# Patient Record
Sex: Female | Born: 1970 | Race: Black or African American | Hispanic: No | Smoking: Never smoker
Health system: Southern US, Community
[De-identification: ages and names within clinical notes are randomized; demographics above are authoritative.]

## PROBLEM LIST (undated history)

## (undated) DIAGNOSIS — I10 Essential (primary) hypertension: Secondary | ICD-10-CM

## (undated) HISTORY — PX: CHOLECYSTECTOMY: SHX55

## (undated) HISTORY — PX: TUBAL LIGATION: SHX77

## (undated) HISTORY — PX: BREAST REDUCTION SURGERY: SHX8

## (undated) HISTORY — PX: BACK SURGERY: SHX140

---

## 2019-04-12 ENCOUNTER — Other Ambulatory Visit: Payer: Self-pay

## 2019-04-12 ENCOUNTER — Encounter (HOSPITAL_BASED_OUTPATIENT_CLINIC_OR_DEPARTMENT_OTHER): Payer: Self-pay

## 2019-04-12 ENCOUNTER — Emergency Department (HOSPITAL_BASED_OUTPATIENT_CLINIC_OR_DEPARTMENT_OTHER)
Admission: EM | Admit: 2019-04-12 | Discharge: 2019-04-12 | Disposition: A | Discharge status: Home / Self Care | Payer: No Typology Code available for payment source | Attending: Emergency Medicine | Admitting: Emergency Medicine

## 2019-04-12 ENCOUNTER — Emergency Department (HOSPITAL_BASED_OUTPATIENT_CLINIC_OR_DEPARTMENT_OTHER): Payer: No Typology Code available for payment source

## 2019-04-12 DIAGNOSIS — S8992XA Unspecified injury of left lower leg, initial encounter: Secondary | ICD-10-CM | POA: Diagnosis present

## 2019-04-12 DIAGNOSIS — Y9301 Activity, walking, marching and hiking: Secondary | ICD-10-CM | POA: Diagnosis not present

## 2019-04-12 DIAGNOSIS — Y929 Unspecified place or not applicable: Secondary | ICD-10-CM | POA: Diagnosis not present

## 2019-04-12 DIAGNOSIS — Z79899 Other long term (current) drug therapy: Secondary | ICD-10-CM | POA: Diagnosis not present

## 2019-04-12 DIAGNOSIS — Z91010 Allergy to peanuts: Secondary | ICD-10-CM | POA: Diagnosis not present

## 2019-04-12 DIAGNOSIS — S82839A Other fracture of upper and lower end of unspecified fibula, initial encounter for closed fracture: Secondary | ICD-10-CM

## 2019-04-12 DIAGNOSIS — Z91040 Latex allergy status: Secondary | ICD-10-CM | POA: Diagnosis not present

## 2019-04-12 DIAGNOSIS — W101XXA Fall (on)(from) sidewalk curb, initial encounter: Secondary | ICD-10-CM | POA: Diagnosis not present

## 2019-04-12 DIAGNOSIS — S82392A Other fracture of lower end of left tibia, initial encounter for closed fracture: Secondary | ICD-10-CM | POA: Diagnosis not present

## 2019-04-12 DIAGNOSIS — W19XXXA Unspecified fall, initial encounter: Secondary | ICD-10-CM

## 2019-04-12 DIAGNOSIS — Z23 Encounter for immunization: Secondary | ICD-10-CM | POA: Insufficient documentation

## 2019-04-12 DIAGNOSIS — I10 Essential (primary) hypertension: Secondary | ICD-10-CM | POA: Insufficient documentation

## 2019-04-12 DIAGNOSIS — Y999 Unspecified external cause status: Secondary | ICD-10-CM | POA: Diagnosis not present

## 2019-04-12 HISTORY — DX: Essential (primary) hypertension: I10

## 2019-04-12 MED ORDER — TETANUS-DIPHTH-ACELL PERTUSSIS 5-2.5-18.5 LF-MCG/0.5 IM SUSP
0.5000 mL | Freq: Once | INTRAMUSCULAR | Status: AC
  Administered 2019-04-12: 20:00:00 0.5 mL via INTRAMUSCULAR
  Filled 2019-04-12: qty 0.5

## 2019-04-12 MED ORDER — ACETAMINOPHEN 500 MG PO TABS
1000.0000 mg | ORAL_TABLET | Freq: Once | ORAL | Status: AC
  Administered 2019-04-12: 1000 mg via ORAL
  Filled 2019-04-12: qty 2

## 2019-04-12 MED ORDER — IBUPROFEN 400 MG PO TABS
400.0000 mg | ORAL_TABLET | Freq: Once | ORAL | Status: AC
  Administered 2019-04-12: 400 mg via ORAL
  Filled 2019-04-12: qty 1

## 2019-04-12 MED ORDER — TRAMADOL HCL 50 MG PO TABS: 50.0000 mg | ORAL_TABLET | Freq: Four times a day (QID) | ORAL | 0 refills | Status: AC | PRN

## 2019-04-12 NOTE — ED Provider Notes (Signed)
[facility] EMERGENCY DEPARTMENT Provider Note   CSN: [id number] Arrival date & time: 04/12/19  1806    History   Chief Complaint Chief Complaint  Patient presents with  . Fall    HPI Megan Garrett is a 48 y.o. female.     HPI  48 year old female with a history of hypertension presents with concern for mechanical fall with left ankle pain.  Patient reports she was loose food and stepped off a small curb step off and fell, falling onto her right knee and hurting her left ankle.  She did not hit her head, no loss of consciousness.  Denies any other injuries from the fall.  No headache, no nausea, no vomiting, no neck pain, no back pain.  Reports abrasion to the right knee, but denies significant pain there.  Reports she was unable to bear any weight on the left ankle with severe pain in that area.  Reports some tingling to the distal foot.  And pain that is significantly worsened by moving. Past Medical History:  Diagnosis Date  . Hypertension     There are no active problems to display for this patient.   Past Surgical History:  Procedure Laterality Date  . BACK SURGERY    . BREAST REDUCTION SURGERY    . CHOLECYSTECTOMY    . TUBAL LIGATION       OB History   No obstetric history on file.      Home Medications    Prior to Admission medications   Medication Sig Start Date End Date Taking? Authorizing Provider  hydrochlorothiazide (HYDRODIURIL) 25 MG tablet Take 25 mg by mouth daily.   Yes [provider]  Multiple Vitamin (MULTIVITAMIN) tablet Take 1 tablet by mouth daily.   Yes [provider]  omega-3 acid ethyl esters (LOVAZA) 1 g capsule Take 1 g by mouth daily.   Yes [provider]  traMADol (ULTRAM) 50 MG tablet Take 1 tablet (50 mg total) by mouth every 6 (six) hours as needed. 04/12/19   Alvira MondaySchlossman, Dorissa Stinnette, MD    Family History No family history on file.  Social History Social History   Tobacco Use  . Smoking status:  Never Smoker  . Smokeless tobacco: Never Used  Substance Use Topics  . Alcohol use: Never    Frequency: Never  . Drug use: Never     Allergies   Latex; Peanut-containing drug products; Pork-derived products; and Sulfa antibiotics   Review of Systems Review of Systems  Constitutional: Negative for fever.  Respiratory: Negative for shortness of breath.   Cardiovascular: Negative for chest pain.  Musculoskeletal: Positive for arthralgias and gait problem.  Skin: Negative for rash.  Neurological: Negative for headaches.     Physical Exam Updated Vital Signs BP (!) 117/103 (BP Location: Right Wrist)   Pulse (!) 110   Temp 98.2 F (36.8 C) (Oral)   Resp 18   Ht 5\' 7"  (1.702 m)   Wt 130.2 kg   LMP 03/22/2019   SpO2 100%   BMI 44.95 kg/m   Physical Exam Vitals signs and nursing note reviewed.  Constitutional:      General: She is not in acute distress.    Appearance: She is well-developed. She is not diaphoretic.  HENT:     Head: Normocephalic and atraumatic.  Eyes:     Conjunctiva/sclera: Conjunctivae normal.  Neck:     Musculoskeletal: Normal range of motion.  Cardiovascular:     Rate and Rhythm: Normal rate and regular  rhythm.     Heart sounds: No gallop.   Pulmonary:     Effort: Pulmonary effort is normal. No respiratory distress.  Musculoskeletal:     Right knee: She exhibits normal range of motion. Lacerations: abrasion.     Left ankle: She exhibits decreased range of motion and swelling. Tenderness. Lateral malleolus and AITFL tenderness found. No medial malleolus, no head of 5th metatarsal and no proximal fibula tenderness found.  Skin:    General: Skin is warm and dry.     Findings: No erythema or rash.  Neurological:     Mental Status: She is alert and oriented to person, place, and time.      ED Treatments / Results  Labs (all labs ordered are listed, but only abnormal results are displayed) Labs Reviewed - No data to display  EKG None   Radiology Dg Ankle Complete Left  Result Date: 04/12/2019 CLINICAL DATA:  48 year old female with left ankle pain after injury EXAM: LEFT ANKLE COMPLETE - 3+ VIEW COMPARISON:  None. FINDINGS: Acute avulsion fracture at the distal fibula with associated soft tissue swelling. Ankle mortise congruent. No radiopaque foreign body. Mild degenerative changes IMPRESSION: Acute avulsion fracture at the distal fibula with associated soft tissue swelling Electronically Signed   By: Gilmer Mor D.O.   On: 04/12/2019 20:06   Dg Knee Complete 4 Views Right  Result Date: 04/12/2019 CLINICAL DATA:  48 year old female with fall and ankle pain EXAM: RIGHT KNEE - COMPLETE 4+ VIEW COMPARISON:  None. FINDINGS: No evidence of fracture, dislocation, or joint effusion. No evidence of arthropathy or other focal bone abnormality. Soft tissues are unremarkable. IMPRESSION: Negative for acute bony abnormality. Electronically Signed   By: Gilmer Mor D.O.   On: 04/12/2019 20:07    Procedures Procedures (including critical care time)  Medications Ordered in ED Medications  Tdap (BOOSTRIX) injection 0.5 mL (0.5 mLs Intramuscular Given 04/12/19 2027)  acetaminophen (TYLENOL) tablet 1,000 mg (1,000 mg Oral Given 04/12/19 2026)  ibuprofen (ADVIL) tablet 400 mg (400 mg Oral Given 04/12/19 2026)     Initial Impression / Assessment and Plan / ED Course  I have reviewed the triage vital signs and the nursing notes.  Pertinent labs & imaging results that were available during my care of the patient were reviewed by me and considered in my medical decision making (see chart for details).        48 year old female with a history of hypertension presents with concern for mechanical fall with left ankle pain.  Fell onto right knee as well, with milder pain.  X-ray of the right knee shows no evidence of abnormality.  She denies any other injuries from her fall.  X-ray of the left ankle shows a distal fibular avulsion fracture.   She has normal distal pulses.  She is placed in a cam walker, crutches, WBAT, recommended follow-up with Dr. Pearletha Forge, ice, ibuprofen/tylenol. Reviewed in [place] drug database and gave prn tramadol rx with discussion of risks.   Final Clinical Impressions(s) / ED Diagnoses   Final diagnoses:  Avulsion fracture of distal end of fibula  Fall, initial encounter    ED Discharge Orders         Ordered    traMADol (ULTRAM) 50 MG tablet  Every 6 hours PRN     04/12/19 2025           Alvira Monday, MD 04/13/19 [phone]

## 2019-04-12 NOTE — Discharge Instructions (Signed)
Take Tylenol 1000 mg 4 times a day for 1 week. This is the maximum dose of Tylenol (acetaminophen) you can take from all sources. Please check other over-the-counter medications and prescriptions to ensure you are not taking other medications that contain acetaminophen.  You may also take ibuprofen 400 mg 6 times a day alternating with or at the same time as tylenol.  Take tramadol as needed for breakthrough pain.  This medication can be addicting, sedating and cause constipation.   °

## 2019-04-12 NOTE — ED Triage Notes (Signed)
Pt sates she stepped off curb ~445pm-injured left ankle and right knee-NAD-to triage in w/c

## 2019-04-17 ENCOUNTER — Ambulatory Visit: Payer: Self-pay | Admitting: Family Medicine

## 2020-03-29 IMAGING — DX RIGHT KNEE - COMPLETE 4+ VIEW
4 series · 4 of 4 positions shown · non-contrast
Comparison: None.

CLINICAL DATA: 47-year-old female with fall and ankle pain

EXAM:
RIGHT KNEE - COMPLETE 4+ VIEW

[knee ap]
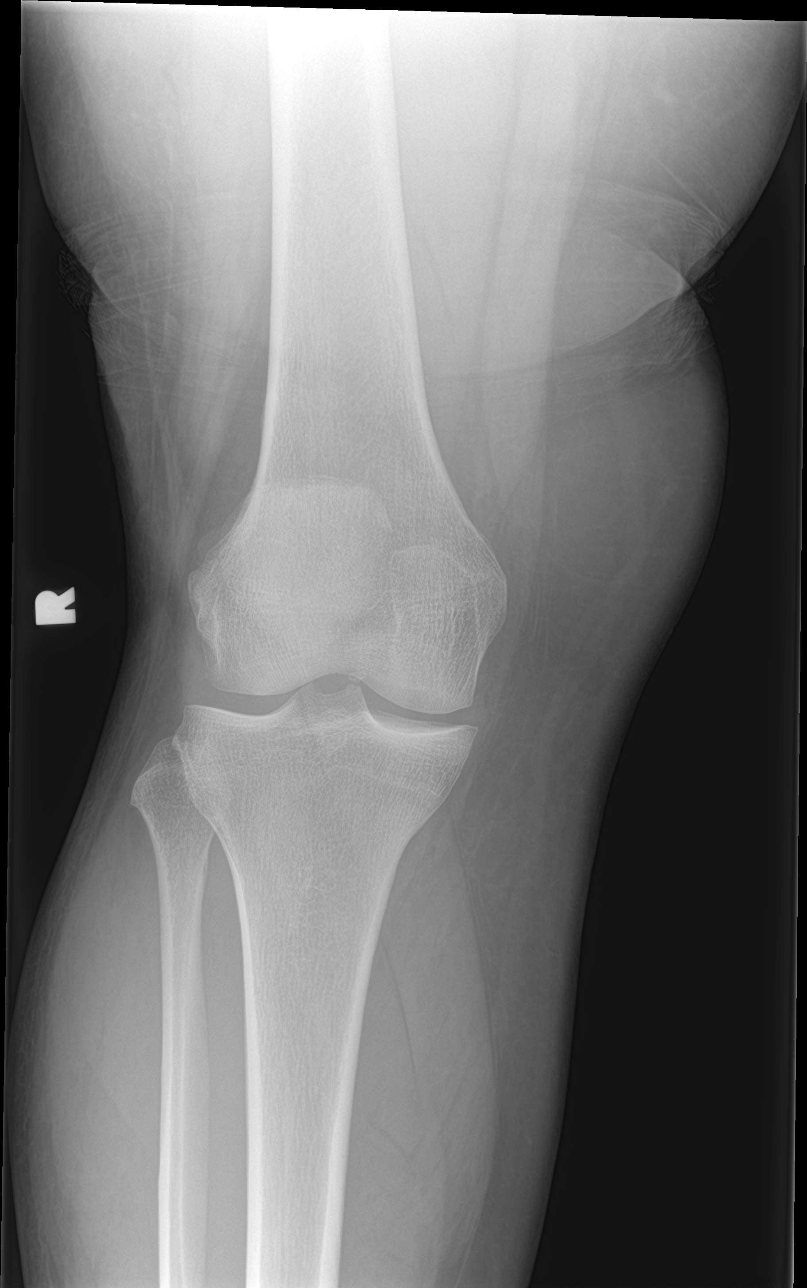

[knee lat]
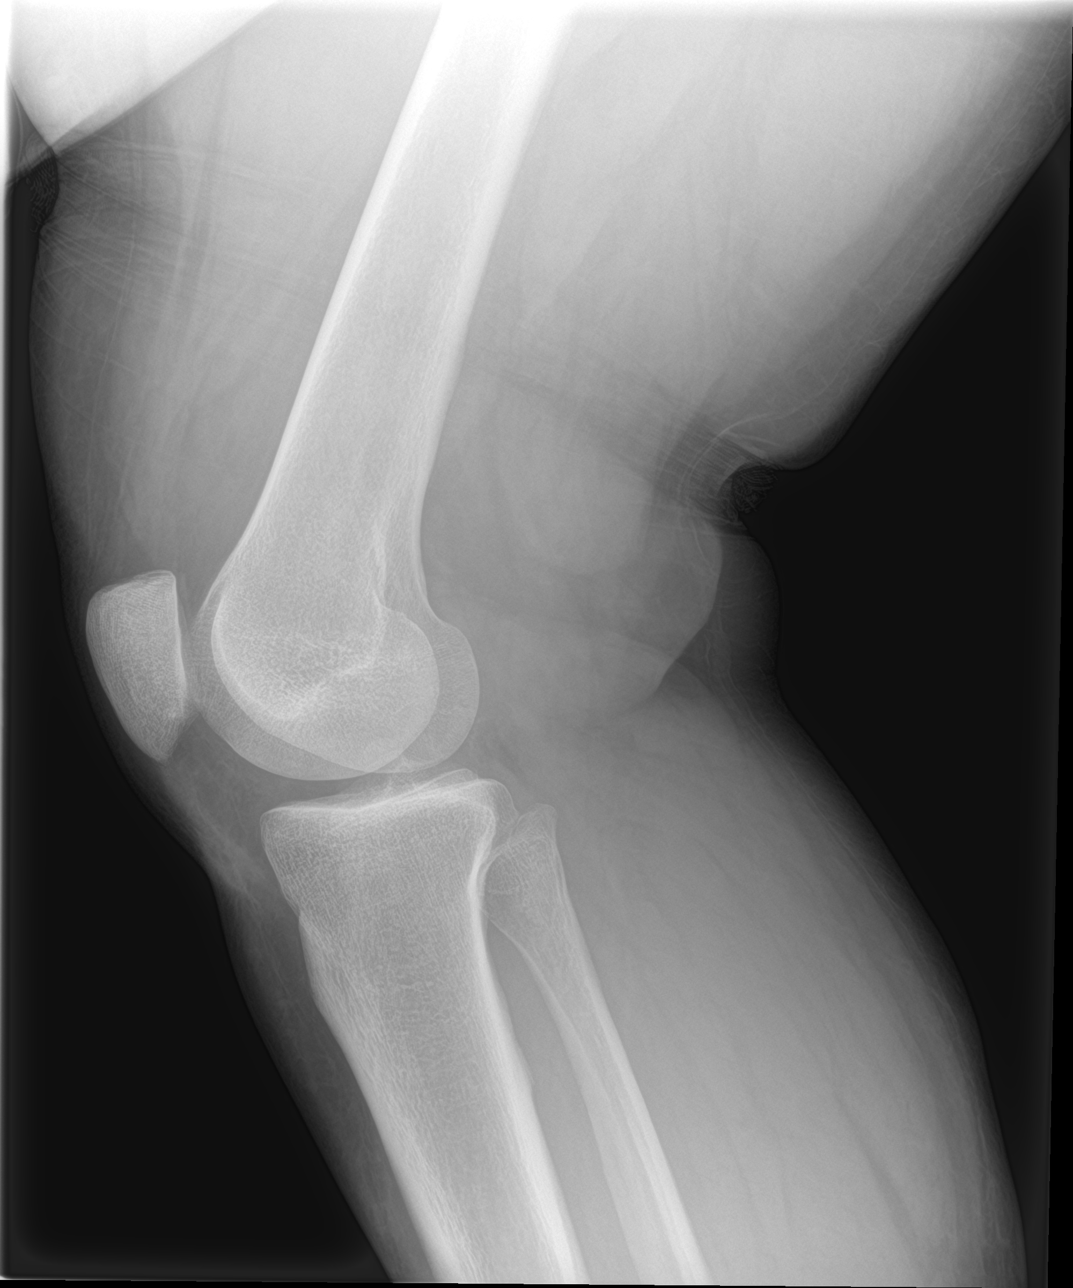

[knee obl (1 of 2)]
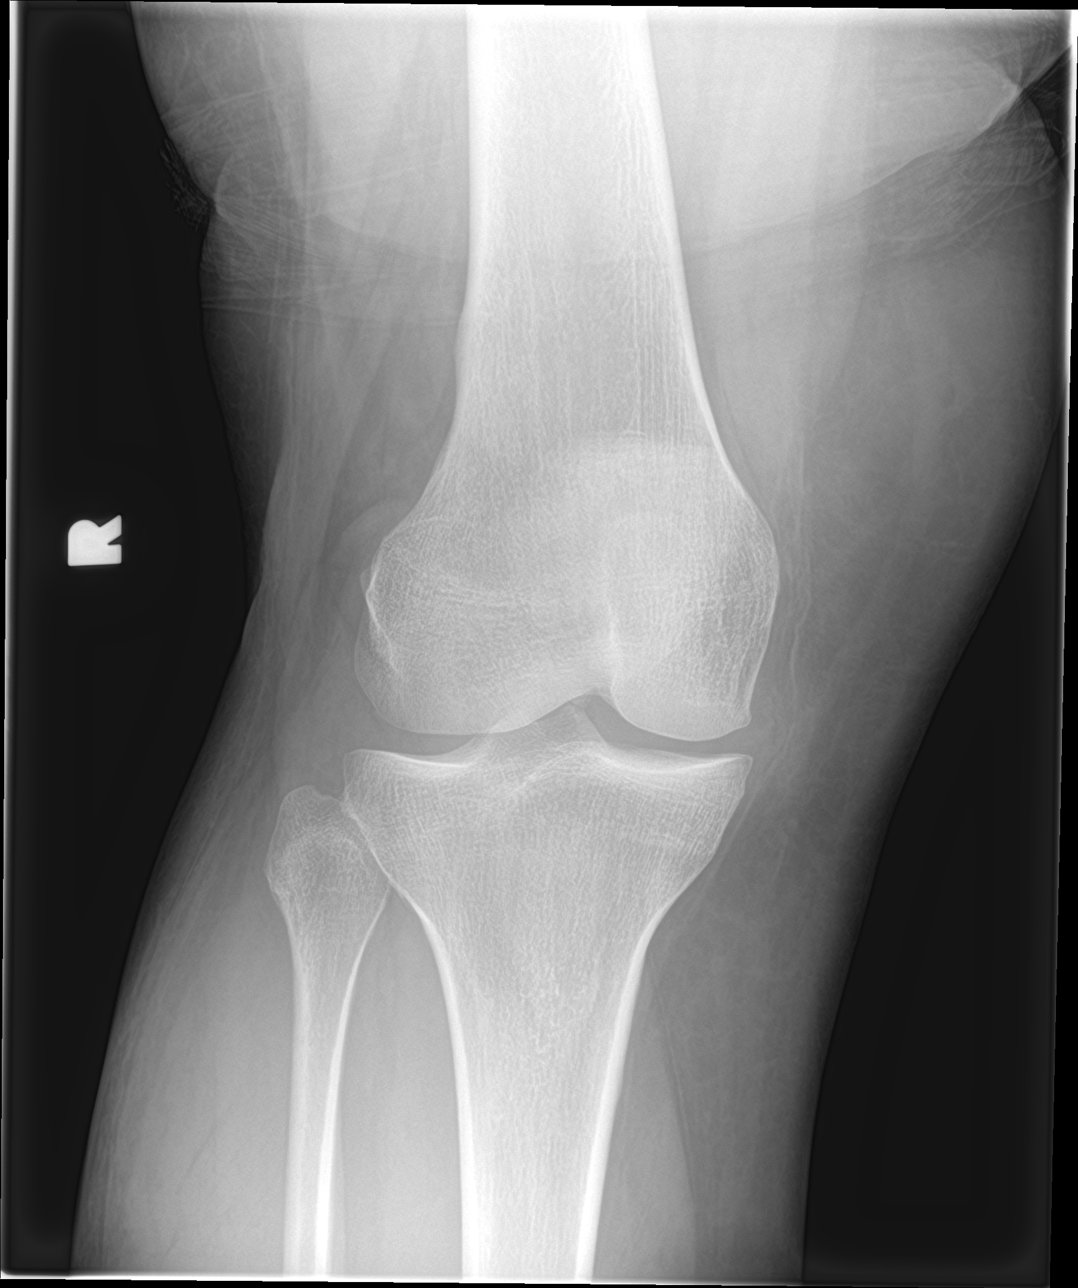

[knee obl (2 of 2)]
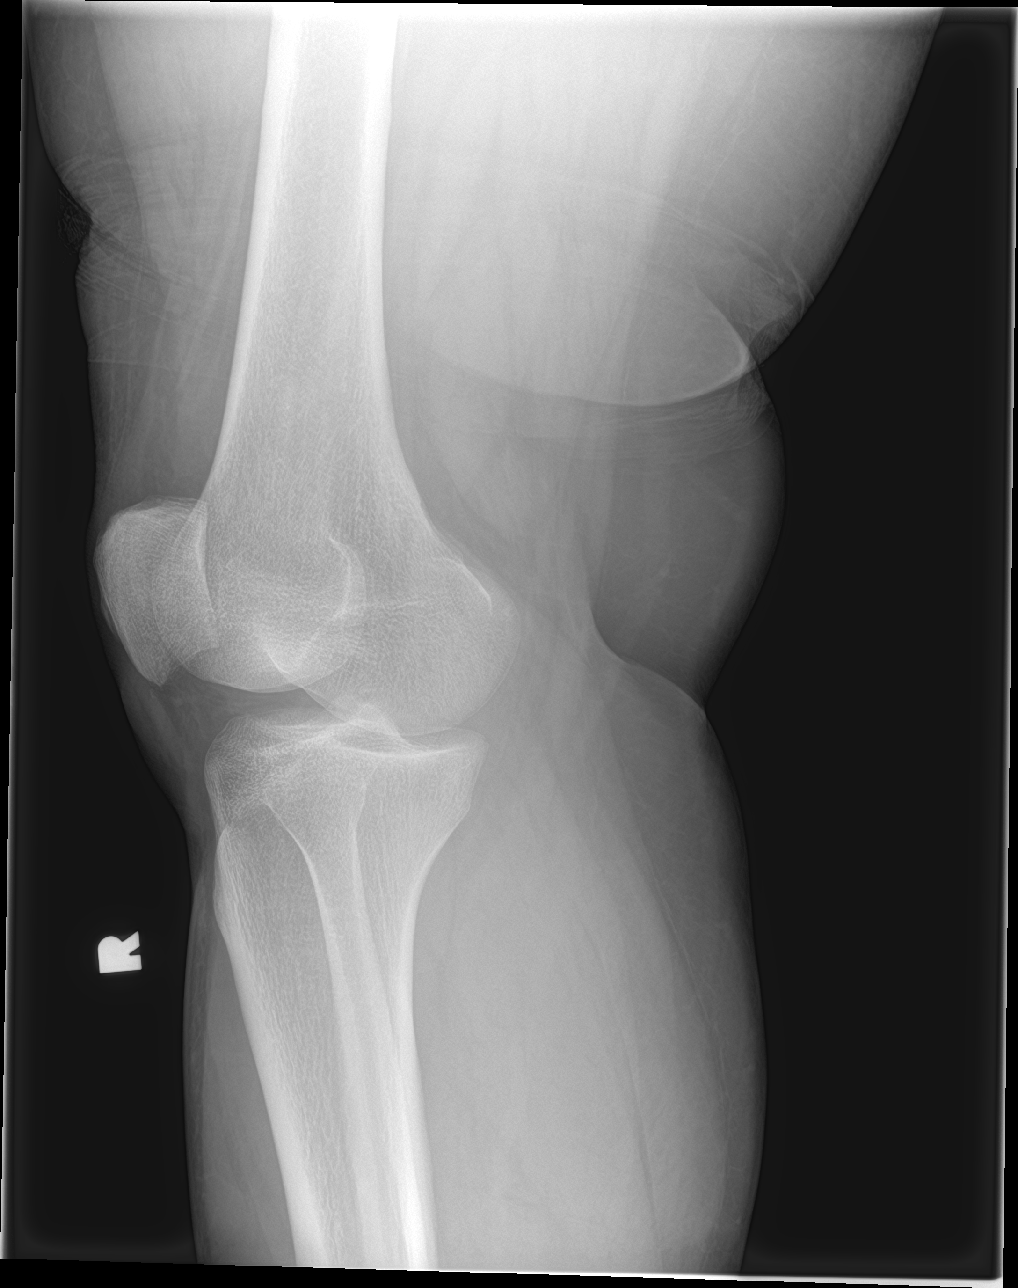

[4 of 4 positions shown; findings below may reference images not displayed]

FINDINGS: No evidence of fracture, dislocation, or joint effusion. No evidence
of arthropathy or other focal bone abnormality. Soft tissues are
unremarkable.
IMPRESSION: Negative for acute bony abnormality.

## 2020-03-29 IMAGING — DX LEFT ANKLE COMPLETE - 3+ VIEW
3 series · 3 of 3 positions shown · non-contrast
Comparison: None.

CLINICAL DATA: 47-year-old female with left ankle pain after injury

EXAM:
LEFT ANKLE COMPLETE - 3+ VIEW

[ankle ap]
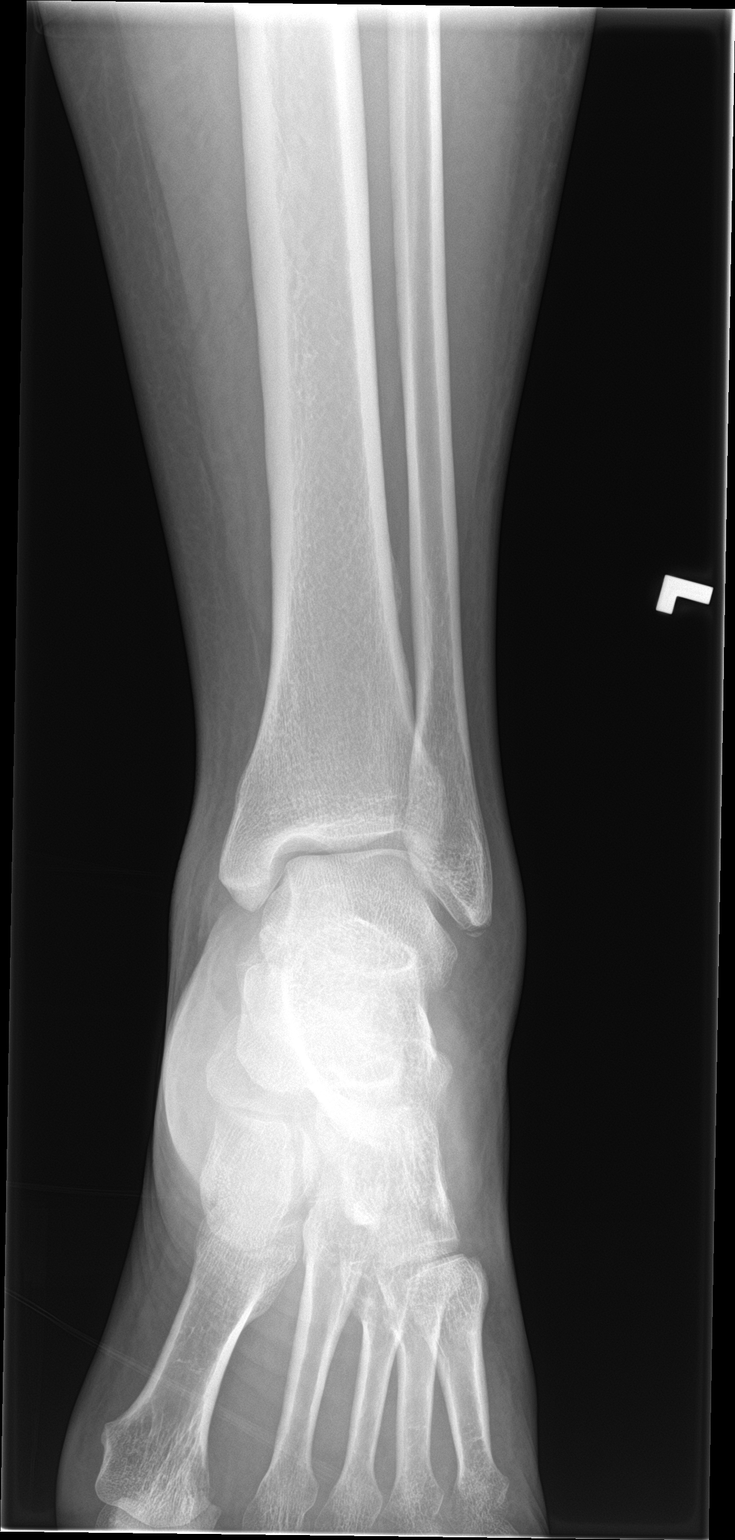

[ankle lat]
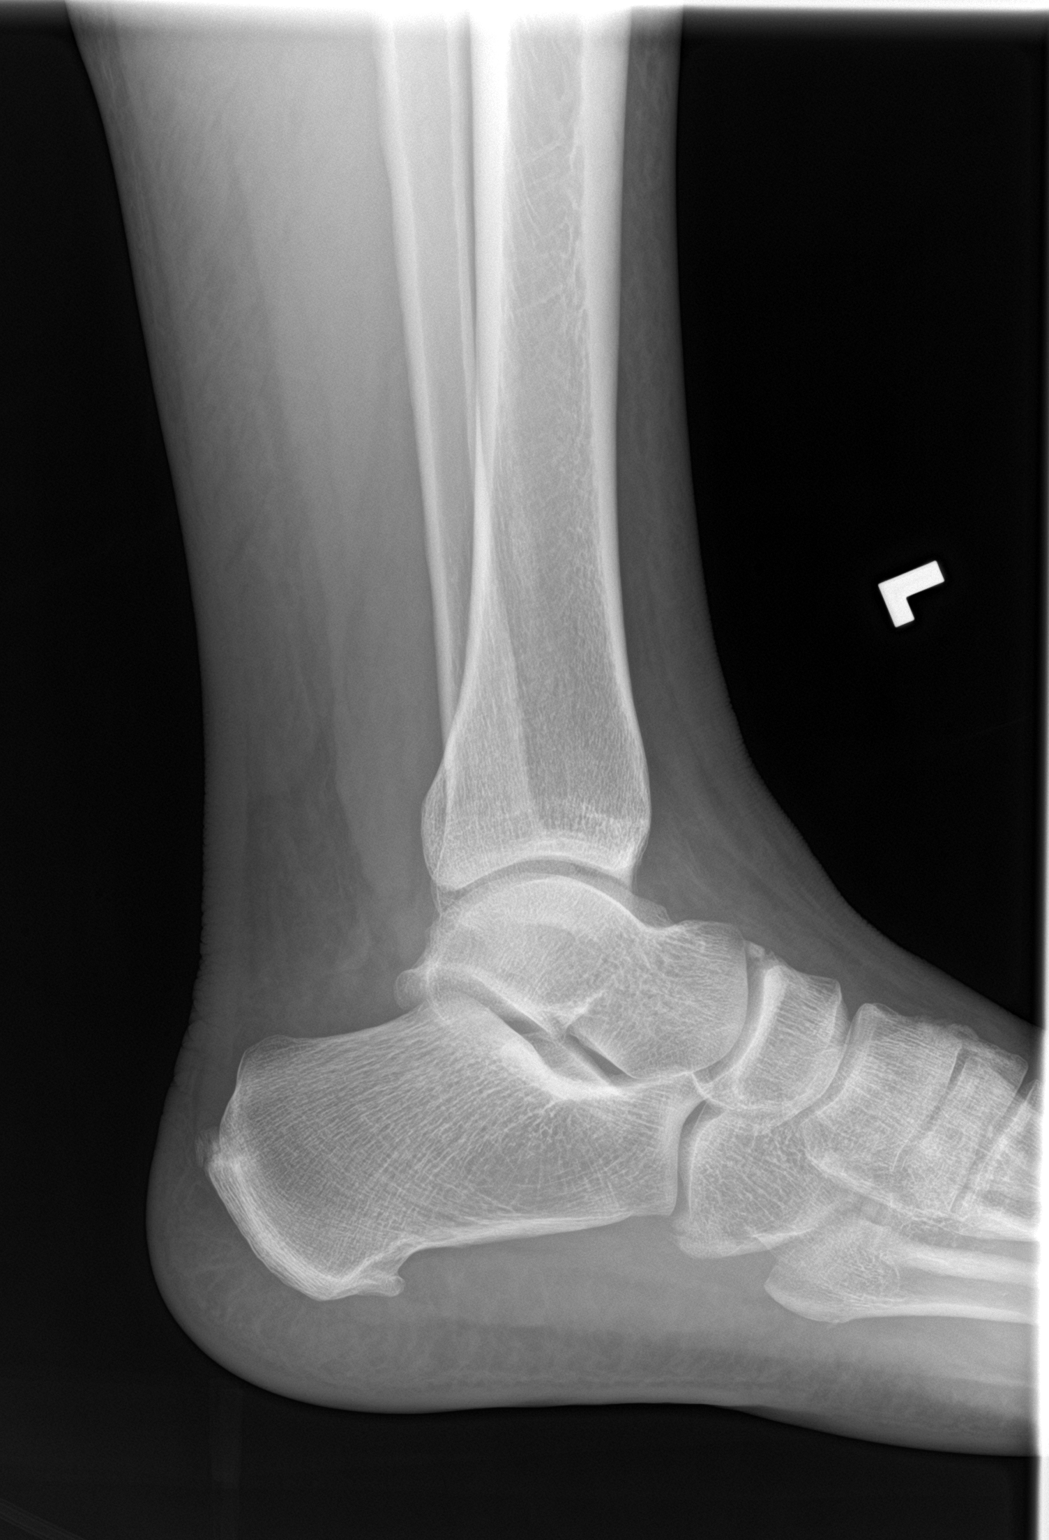

[ankle obl]
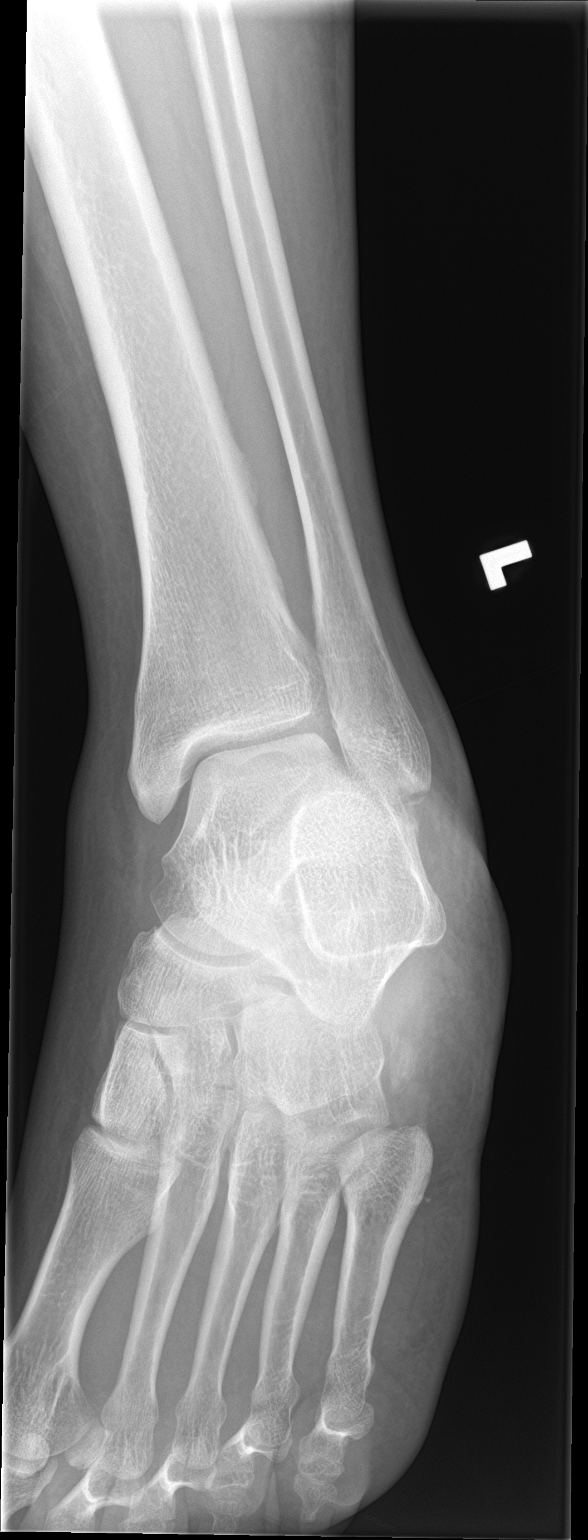

[3 of 3 positions shown; findings below may reference images not displayed]

FINDINGS: Acute avulsion fracture at the distal fibula with associated soft
tissue swelling. Ankle mortise congruent. No radiopaque foreign
body. Mild degenerative changes
IMPRESSION: Acute avulsion fracture at the distal fibula with associated soft
tissue swelling
